# Patient Record
Sex: Male | Born: 1965 | Race: White | Hispanic: No | Marital: Single
Health system: Midwestern US, Academic
[De-identification: ages and names within clinical notes are randomized; demographics above are authoritative.]

---

## 2019-04-09 ENCOUNTER — Encounter: Admit: 2019-04-09 | Discharge: 2019-04-09 | Payer: MEDICARE

## 2019-04-09 NOTE — Telephone Encounter
Navigation Intake Assessment Document    Patient Name:  Connor Evans   MRN:  [id number]  DOB:  Jul 08, 1966  Insurance:  Medicare     Appointment Info:    Future Appointments   Date Time Provider [place]   04/23/2019  9:15 AM Leanna Battles, DO [facility]       Diagnosis & Reason for Visit:  Thrombocytopenia-evaluate and treat.  Patient states he has no previously known history of thrombocytopenia and does not have any prior lab work.  There is some additional information regarding the patient for [facility] in [place].      Physician Info:   Referring Physician:  Playita Cortada [phone]    Comments:  COVID-19 guidelines reviewed with patient, including: visitor and universal masking policies, and a temperature check at the facility entrance upon arrival.

## 2019-04-23 ENCOUNTER — Encounter: Admit: 2019-04-23 | Discharge: 2019-04-23

## 2019-04-23 DIAGNOSIS — H547 Unspecified visual loss: Secondary | ICD-10-CM

## 2019-04-23 DIAGNOSIS — D696 Thrombocytopenia, unspecified: Principal | ICD-10-CM

## 2019-04-23 DIAGNOSIS — K319 Disease of stomach and duodenum, unspecified: Secondary | ICD-10-CM

## 2019-04-23 DIAGNOSIS — I10 Essential (primary) hypertension: Secondary | ICD-10-CM

## 2019-04-23 DIAGNOSIS — D699 Hemorrhagic condition, unspecified: Secondary | ICD-10-CM

## 2019-04-23 DIAGNOSIS — E119 Type 2 diabetes mellitus without complications: Secondary | ICD-10-CM

## 2019-04-23 DIAGNOSIS — F17210 Nicotine dependence, cigarettes, uncomplicated: Secondary | ICD-10-CM

## 2019-04-23 NOTE — Progress Notes
Name: Connor Evans          MRN: [id number]      DOB: 1966/07/14      AGE: 53 y.o.   DATE OF SERVICE: 04/23/2019    Subjective:             Reason for Visit:  Heme/Onc Care      Connor Evans is a 53 y.o. male who presents to my hematology clinic upon referral by his primary care physician for his thrombocytopenia.    Hematology history:  Diagnosis & Reason for Visit: ???  Thrombocytopenia  Patient states he has no previously known history of thrombocytopenia and does not have any prior lab work.     Labs from April 09, 2019:  White blood cell count was 7.3, hemoglobin was 16.0, platelet count was 85,000, his differential was all within normal limits, his sodium was 136, potassium 4.6, calcium was 9.2, total protein 6.7, serum albumin was 4.3, total bilirubin 1.0, alkaline phosphatase was 102, AST was 22, AST was 23, his hepatitis C hepatitis C quantification was not detected, his HIV was nonreactive.  ???  Physician Info:  ??? Referring Physician: ??[facility] [phone]      History of Present Illness  Past Medical History:    Medical History:   Diagnosis Date   ??? Bleeding disorder (HCC)    ??? Diabetes mellitus (HCC)    ??? Hypertension    ??? Stomach disorder    ??? Vision decreased    Hepatitis C treated (interferon treatment)    Past Surgical History:   Surgical History:   Procedure Laterality Date   ??? HX CHOLECYSTECTOMY         Transfusion History: No    Medication list:   Current Outpatient Medications:   ???  metFORMIN (GLUCOPHAGE) 1,000 mg tablet, every 24 hours., Disp: , Rfl:   ???  multivit-min/FA/lycopen/lutein (CENTRUM SILVER MEN PO), Take  by mouth., Disp: , Rfl:   ???  vitamins, multiple cap, as directed Orally, Disp: , Rfl:     Allergy List:   Allergies   Allergen Reactions   ??? Penicillin G Potassium ANAPHYLAXIS       Social History:   Social History     Socioeconomic History   ??? Marital status: Single     Spouse name: Not on file   ??? Number of children: Not on file ??? Years of education: Not on file   ??? Highest education level: Not on file   Occupational History   ??? Not on file   Tobacco Use   ??? Smoking status: Heavy Tobacco Smoker     Packs/day: 1.50     Types: Cigarettes   ??? Smokeless tobacco: Never Used   Substance and Sexual Activity   ??? Alcohol use: Not Currently   ??? Drug use: Yes     Types: Marijuana, IV, Cocaine, Methamphetamines     Comment: cocaine last used 2018, Iv last used 2012   ??? Sexual activity: Not on file   Other Topics Concern   ??? Not on file   Social History Narrative   ??? Not on file       Family History:   Family History   Problem Relation Age of Onset   ??? Diabetes Mother    ??? Thyroid Disease Mother    ??? Diabetes Father    ??? Heart Disease Father    ??? Diabetes Sister  Review of Systems   Constitutional: Negative for chills, diaphoresis, fatigue and fever.   HENT: Negative for congestion.    Eyes: Positive for photophobia. Negative for visual disturbance.   Respiratory: Negative for cough and shortness of breath.    Cardiovascular: Negative for chest pain.   Gastrointestinal: Negative for blood in stool, constipation and diarrhea.   Genitourinary: Positive for hematuria. Negative for difficulty urinating.   Musculoskeletal: Negative for arthralgias, back pain and myalgias.   Skin: Negative for rash.   Neurological: Negative for headaches.   Hematological: Negative for adenopathy. Bruises/bleeds easily.   Psychiatric/Behavioral: Positive for agitation and sleep disturbance.         Objective:         ??? metFORMIN (GLUCOPHAGE) 1,000 mg tablet every 24 hours.   ??? multivit-min/FA/lycopen/lutein (CENTRUM SILVER MEN PO) Take  by mouth.   ??? vitamins, multiple cap as directed Orally     Vitals:    04/23/19 0913   BP: (!) 144/77   BP Source: Arm, Right Upper   Patient Position: Sitting   Pulse: 71   Resp: 18   Temp: 36.4 ???C (97.6 ???F)   TempSrc: Temporal   SpO2: 99%   Weight: 81.5 kg (179 lb 9.6 oz)   Height: 182.2 cm (71.75)   PainSc: Zero Body mass index is 24.53 kg/m???.     Pain Score: Zero       Fatigue Scale: 0-None    Pain Addressed:  N/A    Patient Evaluated for a Clinical Trial: No treatment clinical trial available for this patient.     [place] Cooperative Oncology Group performance status is 0, Fully active, able to carry on all pre-disease performance without restriction.Marland Kitchen     Physical Exam  Constitutional:       Appearance: He is well-developed.   HENT:      Head: Normocephalic and atraumatic.   Eyes:      Pupils: Pupils are equal, round, and reactive to light.   Neck:      Musculoskeletal: Neck supple.   Cardiovascular:      Rate and Rhythm: Normal rate and regular rhythm.   Pulmonary:      Effort: Pulmonary effort is normal.      Breath sounds: Normal breath sounds. No wheezing.   Abdominal:      General: Bowel sounds are normal.      Palpations: Abdomen is soft. There is no mass.   Musculoskeletal:      Right lower leg: No edema.      Left lower leg: No edema.   Skin:     Findings: No rash.   Neurological:      Mental Status: He is alert and oriented to person, place, and time.      Cranial Nerves: No cranial nerve deficit.               Assessment and Plan:  1.Thrombocytopenia-I will recheck a CBC to see where the patient's counts are currently.  His liver function test were within normal limits on April 09, 2019.  I will check a B12 and folate as these nutritional deficiencies can be a cause of thrombocytopenia.  I will check a peripheral smear.  I will order an abdominal ultrasound in the future as splenomegaly (hypersplenism) could be an etiology. I will consider a bone marrow biopsy in the future to rule out a marrow suppression issue.  2. Return to clinic- I will have the patient return to clinic in three  weeks. I recommended smoking cessation.

## 2019-04-24 LAB — CBC AND DIFF
Lab: 0.3 10*3/uL (ref 0–0.80)
Lab: 0.8 10*3/uL — ABNORMAL LOW (ref 1.0–4.8)
Lab: 1 % (ref 0–2)
Lab: 1 % (ref 0–5)
Lab: 10 FL (ref 7–11)
Lab: 12 % (ref 11–15)
Lab: 15 % — ABNORMAL LOW (ref 24–44)
Lab: 15 g/dL (ref 13.5–16.5)
Lab: 32 pg (ref 26–34)
Lab: 35 g/dL (ref 32.0–36.0)
Lab: 4.3 10*3/uL (ref 1.8–7.0)
Lab: 4.6 M/UL (ref 4.4–5.5)
Lab: 42 % (ref 40–50)
Lab: 5.7 10*3/uL (ref 4.5–11.0)
Lab: 6 % (ref 4–12)
Lab: 66 10*3/uL — ABNORMAL LOW (ref 150–400)
Lab: 77 % (ref 41–77)
Lab: 92 FL (ref 80–100)

## 2019-04-24 LAB — FOLATE, SERUM: Lab: >22 ng/mL (ref >3.9)

## 2019-04-24 LAB — PERIPHERAL SMEAR

## 2019-04-24 LAB — VITAMIN B12: Lab: 659 pg/mL (ref 180–914)

## 2019-04-25 ENCOUNTER — Encounter: Admit: 2019-04-25 | Discharge: 2019-04-25

## 2019-04-25 DIAGNOSIS — D696 Thrombocytopenia, unspecified: Secondary | ICD-10-CM

## 2019-04-26 ENCOUNTER — Encounter: Admit: 2019-04-26 | Discharge: 2019-04-26

## 2019-05-14 ENCOUNTER — Encounter: Admit: 2019-05-14 | Discharge: 2019-05-14 | Payer: MEDICARE

## 2019-05-14 NOTE — Progress Notes
Name: Connor Evans          MRN: [id number]      DOB: July 12, 1966      AGE: 53 y.o.   DATE OF SERVICE: 05/14/2019    Subjective:             Reason for Visit:  Heme/Onc Care      Connor Evans is a 53 y.o. male who presents to my hematology clinic for follow-up for his thrombocytopenia.        History of Present Illness    Hematology history:  Diagnosis & Reason for Visit: ?  Thrombocytopenia  Patient states he has no previously known history of thrombocytopenia and does not have any prior lab work.     Labs from April 09, 2019:  White blood cell count was 7.3, hemoglobin was 16.0, platelet count was 85,000, his differential was all within normal limits, his sodium was 136, potassium 4.6, calcium was 9.2, total protein 6.7, serum albumin was 4.3, total bilirubin 1.0, alkaline phosphatase was 102, AST was 22, AST was 23, his hepatitis C hepatitis C quantification was not detected, his HIV was nonreactive.  ?  Physician Info:  ? Referring Physician: ?[facility] [phone]    Labs from April 23, 2019:  Hemoglobin of 15.1 with a hematocrit of 42.7, platelet count of 66,000, white blood cell count was 5.7, his differential was within normal limits except his absolute lymphocyte count was 0.87, his peripheral smear showed that his red cells appear normal he had an absolute lymphocytopenia and a moderate thrombocytopenia with normal platelet morphology, his folate was greater than 22.3 and his B12 was 659.      An abdominal ultrasound from April 25, 2019:  Showed appearance of the liver suspicious for an underlying cirrhosis.  Splenomegaly is also noted.     Review of Systems   Constitutional: Negative for chills, diaphoresis, fatigue and fever.   HENT: Negative for congestion.    Eyes: Positive for photophobia. Negative for visual disturbance.   Respiratory: Negative for cough and shortness of breath.    Cardiovascular: Negative for chest pain. Gastrointestinal: Negative for blood in stool, constipation and diarrhea.   Genitourinary: Negative for difficulty urinating.   Musculoskeletal: Negative for arthralgias, back pain and myalgias.   Skin: Negative for rash.   Neurological: Negative for headaches.   Hematological: Negative for adenopathy. Bruises/bleeds easily.   Psychiatric/Behavioral: Negative.          Objective:         ? metFORMIN (GLUCOPHAGE) 1,000 mg tablet every 24 hours.   ? multivit-min/FA/lycopen/lutein (CENTRUM SILVER MEN PO) Take  by mouth.   ? vitamins, multiple cap as directed Orally     Vitals:    05/14/19 0759   BP: (!) 154/73   BP Source: Arm, Left Upper   Patient Position: Sitting   Pulse: 79   Resp: 18   Temp: 36.4 ?C (97.6 ?F)   TempSrc: Oral   SpO2: 100%   Weight: 82.2 kg (181 lb 3.2 oz)   Height: 182.2 cm (71.75)   PainSc: Zero     Body mass index is 24.75 kg/m?Marland Kitchen     Pain Score: Zero       Fatigue Scale: 0-None    Pain Addressed:  N/A    Patient Evaluated for a Clinical Trial: No treatment clinical trial available for this patient.     [place] Cooperative Oncology Group performance status is 0, Fully active, able to carry on all  pre-disease performance without restriction.Marland Kitchen     Physical Exam  Constitutional:       Appearance: He is well-developed.   HENT:      Head: Normocephalic and atraumatic.   Eyes:      Pupils: Pupils are equal, round, and reactive to light.   Neck:      Musculoskeletal: Neck supple.   Cardiovascular:      Rate and Rhythm: Normal rate and regular rhythm.   Pulmonary:      Effort: Pulmonary effort is normal.      Breath sounds: Normal breath sounds. No wheezing.   Abdominal:      General: Bowel sounds are normal.      Palpations: Abdomen is soft. There is no mass.   Musculoskeletal:      Right lower leg: No edema.      Left lower leg: No edema.   Skin:     Findings: No rash.   Neurological:      Mental Status: He is alert and oriented to person, place, and time. Cranial Nerves: No cranial nerve deficit.               Assessment and Plan:  1.Thrombocytopenia-labs from April 23, 2019 was unrevealing except that he had a platelet count of 66,000 and a lymphocyte count of 870.  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  I would consider checking a bone marrow biopsy to total the rule out a marrow infiltrative process. Khayree would like me to hold off on the bone marrow biopsy as he states if he has cancer he would not want it treated. 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  I will check a CBC in 3 months.  2. Return to clinic- I will have the patient return to clinic in three months. I recommended smoking cessation.    I answered the patient's questions to his satisfaction.  I spent 40 minutes of this visit in which at least 50% of it was face to face consultation with the patient.

## 2019-08-20 ENCOUNTER — Encounter: Admit: 2019-08-20 | Discharge: 2019-08-20 | Payer: MEDICARE

## 2023-02-10 IMAGING — US U/S ABI ARTERIAL LIMITED ([ID])
1 series · 3 of 3 positions shown · non-contrast
Comparison: none

Patient Name:HASA

PatientID:[id number]
ANKLE BRACHIAL INDICES
INDICATION: Peripheral vascular disease pain
TECHNIQUE: Noninvasive physiologic studies of the upper and lower extremity
arteries were performed at the ankle level to evaluate ankle brachial indices
and waveforms. Dorsalis pedis and posterior tibial ABIs were calculated..

[Series 1: u/s abi arterial limited ((id)) · arterial · 3 of 3 slices shown]
[im 1/3]
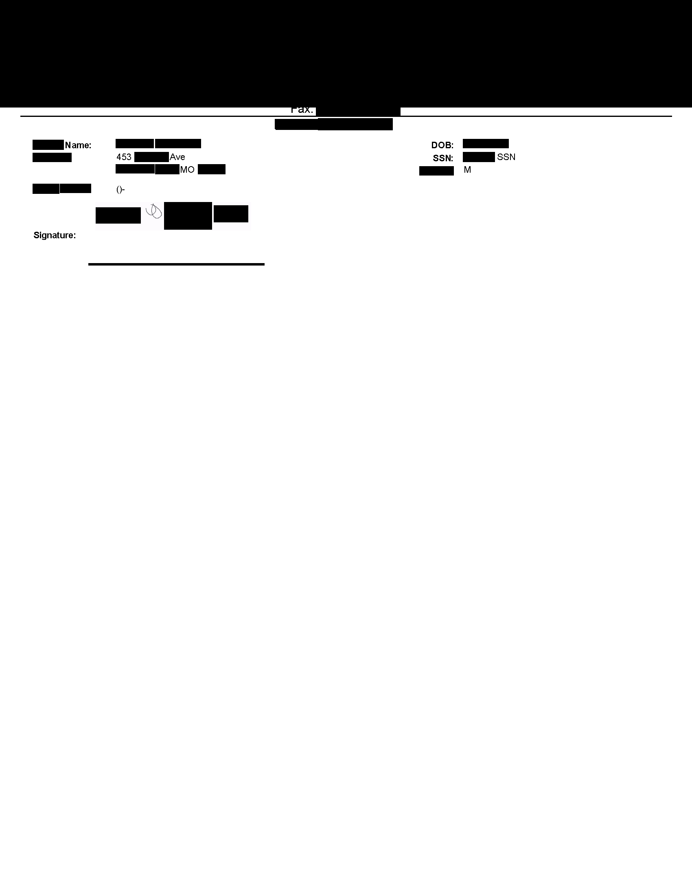
[im 2/3]
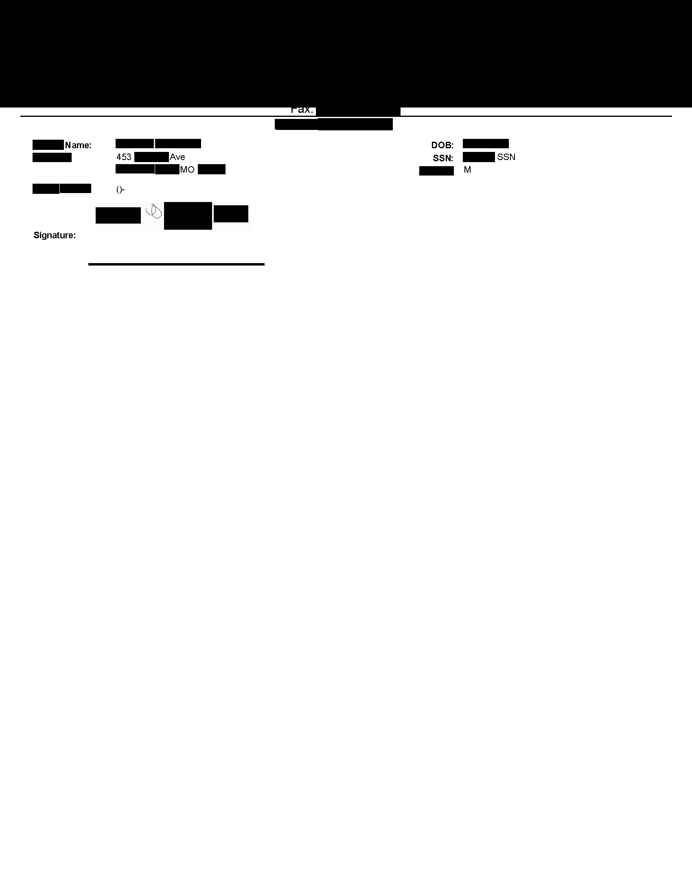
[im 3/3  full-range]
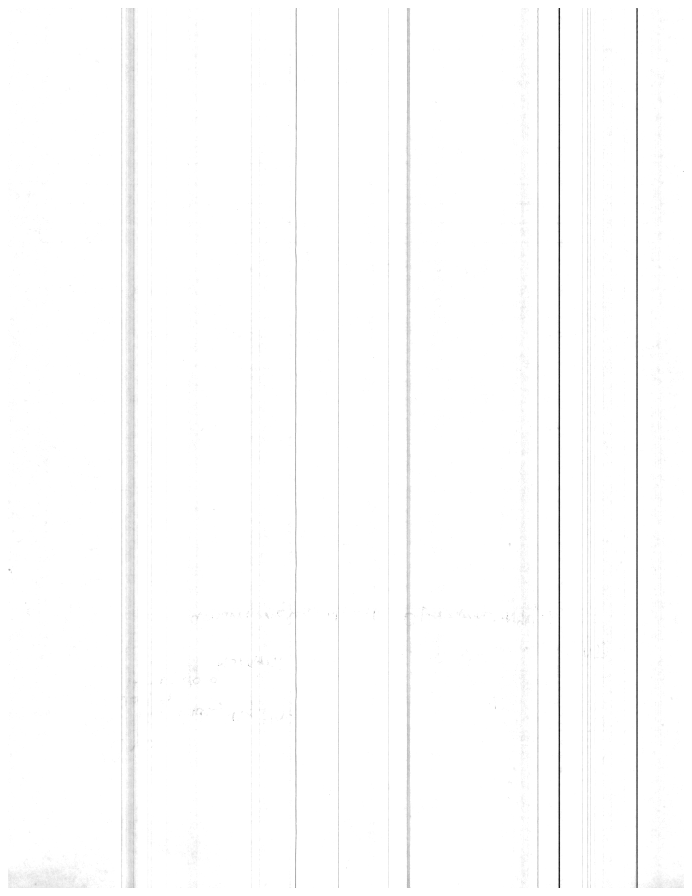

[3 of 3 positions shown; findings below may reference images not displayed]

FINDINGS: SEGMENTAL PRESSURES  ON THE RIGHT ARE AS FOLLOWS:        

Right arm: 119                      

Posterior tibial: 165

Dorsalis pedis: 160

SEGMENTAL PRESSURES ON THE LEFT ARE AS FOLLOWS:

Left arm: 140

Posterior tibial: 146

Dorsalis pedis: 142

ANKLE BRACHIAL INDICES ON THE RIGHT:

Posterior tibial:

Dorsalis pedis:

ANKLE BRACHIAL INDICES ON THE LEFT:

Posterior tibial:

Dorsalis pedis:

NORMAL ABIs = 1.0 OR GREATER, MILD DISEASE =1.0-0.8, MODERATE DISEASE=

MODERATE TO SEVERE= 0.6-0.4, SEVERE DISEASE= LESS THAN
IMPRESSION: Normal ankle-brachial indices bilaterally.

 PM,

[id number]
